# Patient Record
Sex: Female | Born: 2000 | Race: Black or African American | Hispanic: No | Marital: Single | Smoking: Never smoker
Health system: Southern US, Community
[De-identification: ages and names within clinical notes are randomized; demographics above are authoritative.]

---
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
## 2020-04-30 ENCOUNTER — Ambulatory Visit (INDEPENDENT_AMBULATORY_CARE_PROVIDER_SITE_OTHER): Payer: Medicaid Other

## 2020-04-30 ENCOUNTER — Encounter (HOSPITAL_COMMUNITY): Payer: Self-pay

## 2020-04-30 ENCOUNTER — Other Ambulatory Visit: Payer: Self-pay

## 2020-04-30 ENCOUNTER — Ambulatory Visit (HOSPITAL_COMMUNITY)
Admission: EM | Admit: 2020-04-30 | Discharge: 2020-04-30 | Disposition: A | Discharge status: Home / Self Care | Payer: Medicaid Other | Attending: Family Medicine | Admitting: Family Medicine

## 2020-04-30 DIAGNOSIS — M25571 Pain in right ankle and joints of right foot: Secondary | ICD-10-CM | POA: Diagnosis not present

## 2020-04-30 DIAGNOSIS — S93491A Sprain of other ligament of right ankle, initial encounter: Secondary | ICD-10-CM

## 2020-04-30 DIAGNOSIS — S99911A Unspecified injury of right ankle, initial encounter: Secondary | ICD-10-CM

## 2020-04-30 NOTE — ED Triage Notes (Signed)
Pt presents with pain and swelling in the right ankle x 1 day. States she twisted right ankle when walking with platform shoes.

## 2020-04-30 NOTE — Discharge Instructions (Signed)
If not allergic, you may use over the counter ibuprofen or acetaminophen as needed. ° °

## 2020-05-03 NOTE — ED Provider Notes (Signed)
  [facility]   [id number] 04/30/20 Arrival Time: 1209  ASSESSMENT & PLAN:  1. Acute right ankle pain   2. Sprain of anterior talofibular ligament of right ankle, initial encounter     I have personally viewed the imaging studies ordered this visit. No fx appreciated.  Prefers OTC ibuprofen. See AVS for instructions.  Orders Placed This Encounter  Procedures  . DG Ankle Complete Right  . Apply CAM boot   WBAT  Recommend:  Follow-up Information    [facility].   Why: If worsening or failing to improve as anticipated. Contact information: [address] [place] [address] [phone]               Reviewed expectations re: course of current medical issues. Questions answered. Outlined signs and symptoms indicating need for more acute intervention. Patient verbalized understanding. After Visit Summary given.  SUBJECTIVE: History from: patient. Phyllis Baldwin is a 19 y.o. female who reports inversion injury to R ankle; yest; walking platform shoes; twisted; gradual onset of discomfort. Able to bear wt but with discomfort. No extremity sensation changes or weakness. No OTC tx.    OBJECTIVE:  Vitals:   04/30/20 1256  BP: 109/74  Pulse: 97  Resp: 17  Temp: 98.5 F (36.9 C)  TempSrc: Oral  SpO2: 98%    General appearance: alert; no distress HEENT: [place]; AT Neck: supple with FROM Resp: unlabored respirations Extremities: . RLE: warm with well perfused appearance; fairly well localized moderate tenderness over right lateral ankle; without gross deformities; swelling: minimal; bruising: none; ankle ROM: normal, with discomfort CV: brisk extremity capillary refill of RLE; 2+ DP pulse of RLE. Skin: warm and dry; no visible rashes Neurologic: normal sensation and strength of RLE Psychological: alert and cooperative; normal mood and affect  Imaging: DG Ankle Complete Right  Result Date:  04/30/2020 CLINICAL DATA:  Right ankle injury. EXAM: RIGHT ANKLE - COMPLETE 3+ VIEW COMPARISON:  None. FINDINGS: There is no evidence of fracture, dislocation, or joint effusion. There is no evidence of arthropathy or other focal bone abnormality. Soft tissues are unremarkable. IMPRESSION: Negative. Electronically Signed   By: Lupita Raider M.D.   On: 04/30/2020 13:27      No Known Allergies  History reviewed. No pertinent past medical history. Social History   Socioeconomic History  . Marital status: Single    Spouse name: Not on file  . Number of children: Not on file  . Years of education: Not on file  . Highest education level: Not on file  Occupational History  . Not on file  Tobacco Use  . Smoking status: Never Smoker  . Smokeless tobacco: Never Used  Substance and Sexual Activity  . Alcohol use: Yes  . Drug use: Never  . Sexual activity: Never  Other Topics Concern  . Not on file  Social History Narrative  . Not on file   Social Determinants of Health   Financial Resource Strain: Not on file  Food Insecurity: Not on file  Transportation Needs: Not on file  Physical Activity: Not on file  Stress: Not on file  Social Connections: Not on file   History reviewed. No pertinent family history. History reviewed. No pertinent surgical history.    Mardella Layman, MD 05/03/20 [phone]

## 2020-11-26 ENCOUNTER — Ambulatory Visit (HOSPITAL_COMMUNITY)
Admission: EM | Admit: 2020-11-26 | Discharge: 2020-11-26 | Disposition: A | Discharge status: Home / Self Care | Payer: Medicaid Other | Attending: Urgent Care | Admitting: Urgent Care

## 2020-11-26 ENCOUNTER — Other Ambulatory Visit: Payer: Self-pay

## 2020-11-26 ENCOUNTER — Encounter (HOSPITAL_COMMUNITY): Payer: Self-pay

## 2020-11-26 DIAGNOSIS — A084 Viral intestinal infection, unspecified: Secondary | ICD-10-CM

## 2020-11-26 DIAGNOSIS — R109 Unspecified abdominal pain: Secondary | ICD-10-CM

## 2020-11-26 DIAGNOSIS — R112 Nausea with vomiting, unspecified: Secondary | ICD-10-CM

## 2020-11-26 MED ORDER — ONDANSETRON 8 MG PO TBDP: 8.0000 mg | ORAL_TABLET | Freq: Three times a day (TID) | ORAL | 0 refills | Status: DC | PRN

## 2020-11-26 MED ORDER — LOPERAMIDE HCL 2 MG PO CAPS: 2.0000 mg | ORAL_CAPSULE | Freq: Two times a day (BID) | ORAL | 0 refills | Status: DC | PRN

## 2020-11-26 NOTE — ED Triage Notes (Signed)
Pt presents with headache, nausea, vomiting, and generalized abdominal cramping X 3 days.

## 2020-11-26 NOTE — ED Provider Notes (Signed)
[facility]   MRN: [id number] DOB: 09/09/2000  Subjective:   Phyllis Baldwin is a 20 y.o. female presenting for 3 day history of nausea, vomiting, diarrhea. Started to have abdominal cramping.  Denies fever, sore throat, cough, chest pain, shortness of breath.  Distance travel outside the country, no recent antibiotic use, bloody stools, hospitalizations.  No history of GI issues.  Patient is not sexually active, has not been for 2 years.  Does not want a pregnancy test or STI testing.  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  Does not want this testing.  She is not currently taking any medications and has no known food or drug allergies.  Denies past medical and surgical history.   History reviewed. No pertinent family history.  Social History   Tobacco Use   Smoking status: Never   Smokeless tobacco: Never  Substance Use Topics   Alcohol use: Yes   Drug use: Never    ROS   Objective:   Vitals: BP 127/85 (BP Location: Right Arm)   Pulse (!) 101   Temp 98.7 F (37.1 C) (Oral)   Resp 18   LMP  (LMP Unknown)   SpO2 95%   Physical Exam Constitutional:      General: She is not in acute distress.    Appearance: Normal appearance. She is well-developed and normal weight. She is not ill-appearing, toxic-appearing or diaphoretic.  HENT:     Head: Normocephalic and atraumatic.     Right Ear: External ear normal.     Left Ear: External ear normal.     Nose: Nose normal.     Mouth/Throat:     Mouth: Mucous membranes are moist.     Pharynx: Oropharynx is clear.  Eyes:     General: No scleral icterus.    Extraocular Movements: Extraocular movements intact.     Pupils: Pupils are equal, round, and reactive to light.  Cardiovascular:     Rate and Rhythm: Normal rate and regular rhythm.     Pulses: Normal pulses.     Heart sounds: Normal heart sounds. No murmur heard.   No friction rub. No gallop.   Pulmonary:     Effort: Pulmonary effort is normal. No respiratory distress.     Breath sounds: Normal breath sounds. No stridor. No wheezing, rhonchi or rales.  Abdominal:     General: Bowel sounds are increased. There is no distension.     Palpations: Abdomen is soft. There is no mass.     Tenderness: There is generalized abdominal tenderness and tenderness in the right lower quadrant, periumbilical area, suprapubic area and left lower quadrant. There is no right CVA tenderness, left CVA tenderness, guarding or rebound.  Skin:    General: Skin is warm and dry.     Coloration: Skin is not pale.     Findings: No rash.  Neurological:     General: No focal deficit present.     Mental Status: She is alert and oriented to person, place, and time.  Psychiatric:        Mood and Affect: Mood normal.        Behavior: Behavior normal.        Thought Content: Thought content normal.        Judgment: Judgment normal.     Assessment and Plan :   PDMP not reviewed this encounter.  1. Viral gastroenteritis   2. Nausea vomiting  and diarrhea   3. Abdominal pain, unspecified abdominal location     Will manage for suspected viral gastroenteritis with supportive care.  Recommended patient hydrate well, eat light meals and maintain electrolytes.  Will use Zofran and Imodium for nausea, vomiting and diarrhea. Counseled patient on potential for adverse effects with medications prescribed/recommended today, ER and return-to-clinic precautions discussed, patient verbalized understanding.    Wallis Bamberg, [place] 11/26/20 9357

## 2021-03-29 IMAGING — DX DG ANKLE COMPLETE 3+V*R*
3 series · 3 of 3 positions shown · non-contrast
Comparison: None.

CLINICAL DATA: Right ankle injury.

EXAM:
RIGHT ANKLE - COMPLETE 3+ VIEW

[ankle ap]
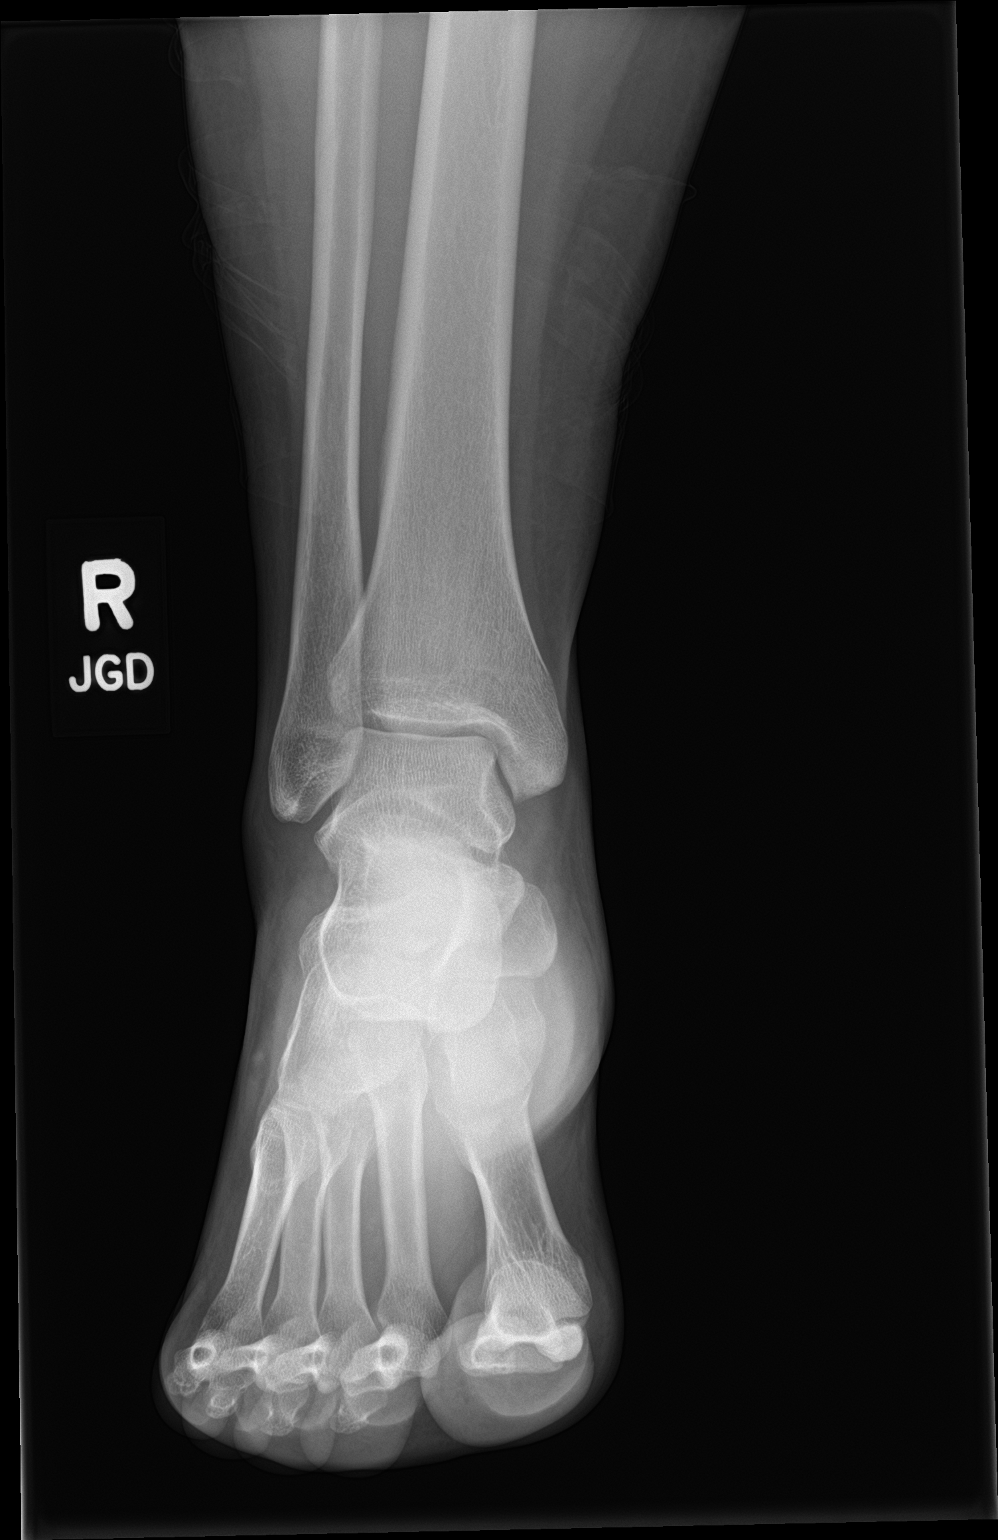

[ankle obl]
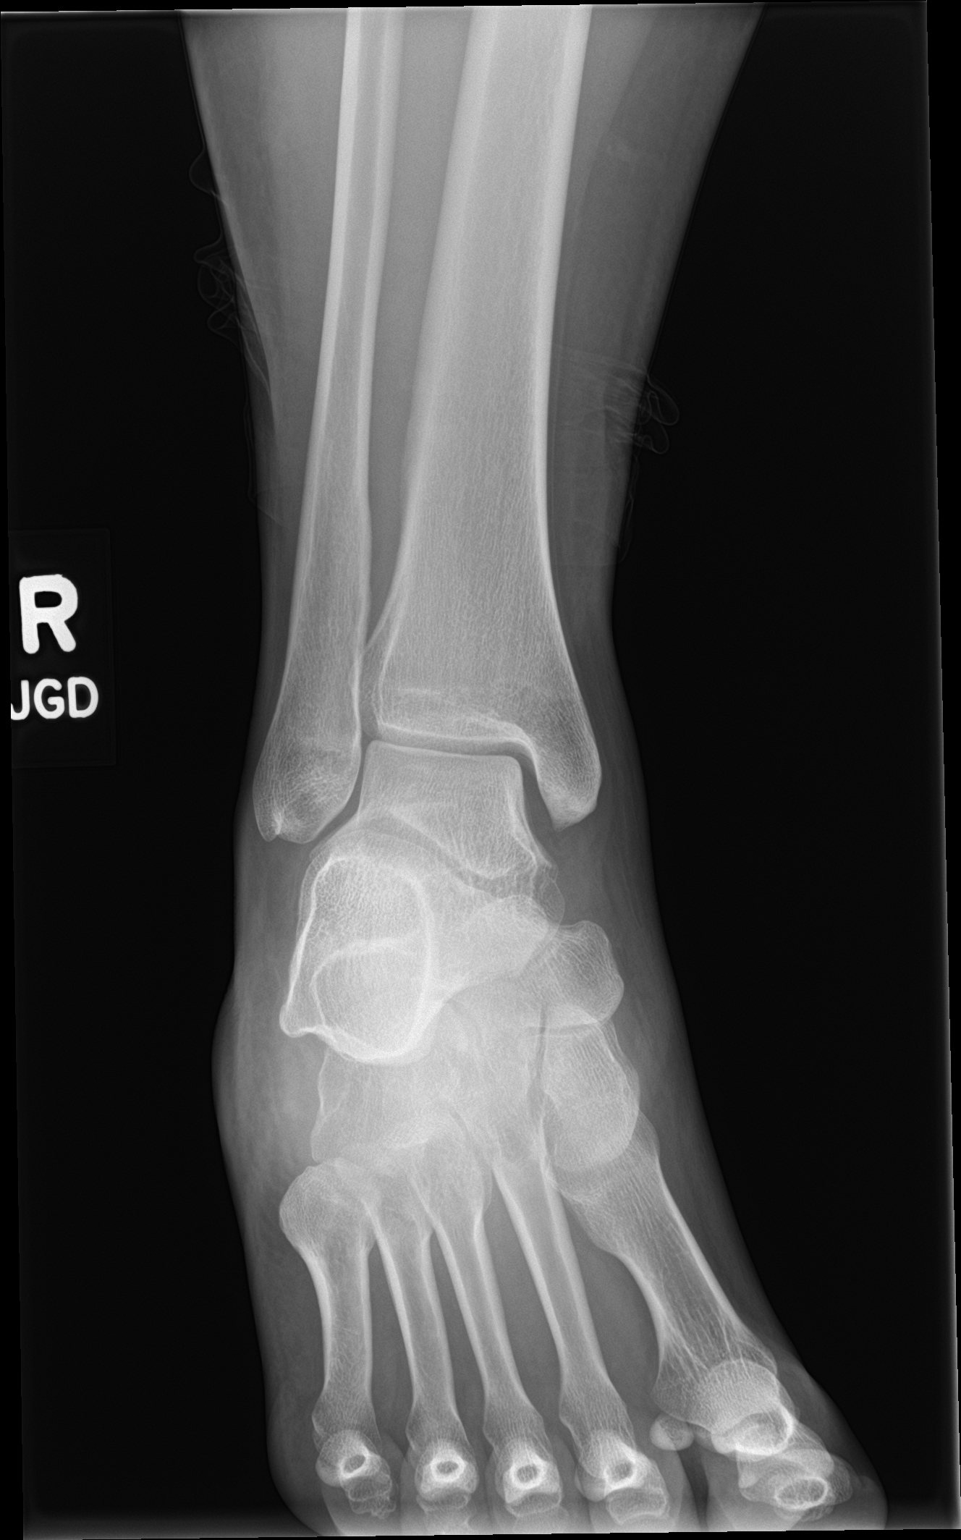

[ankle lat]
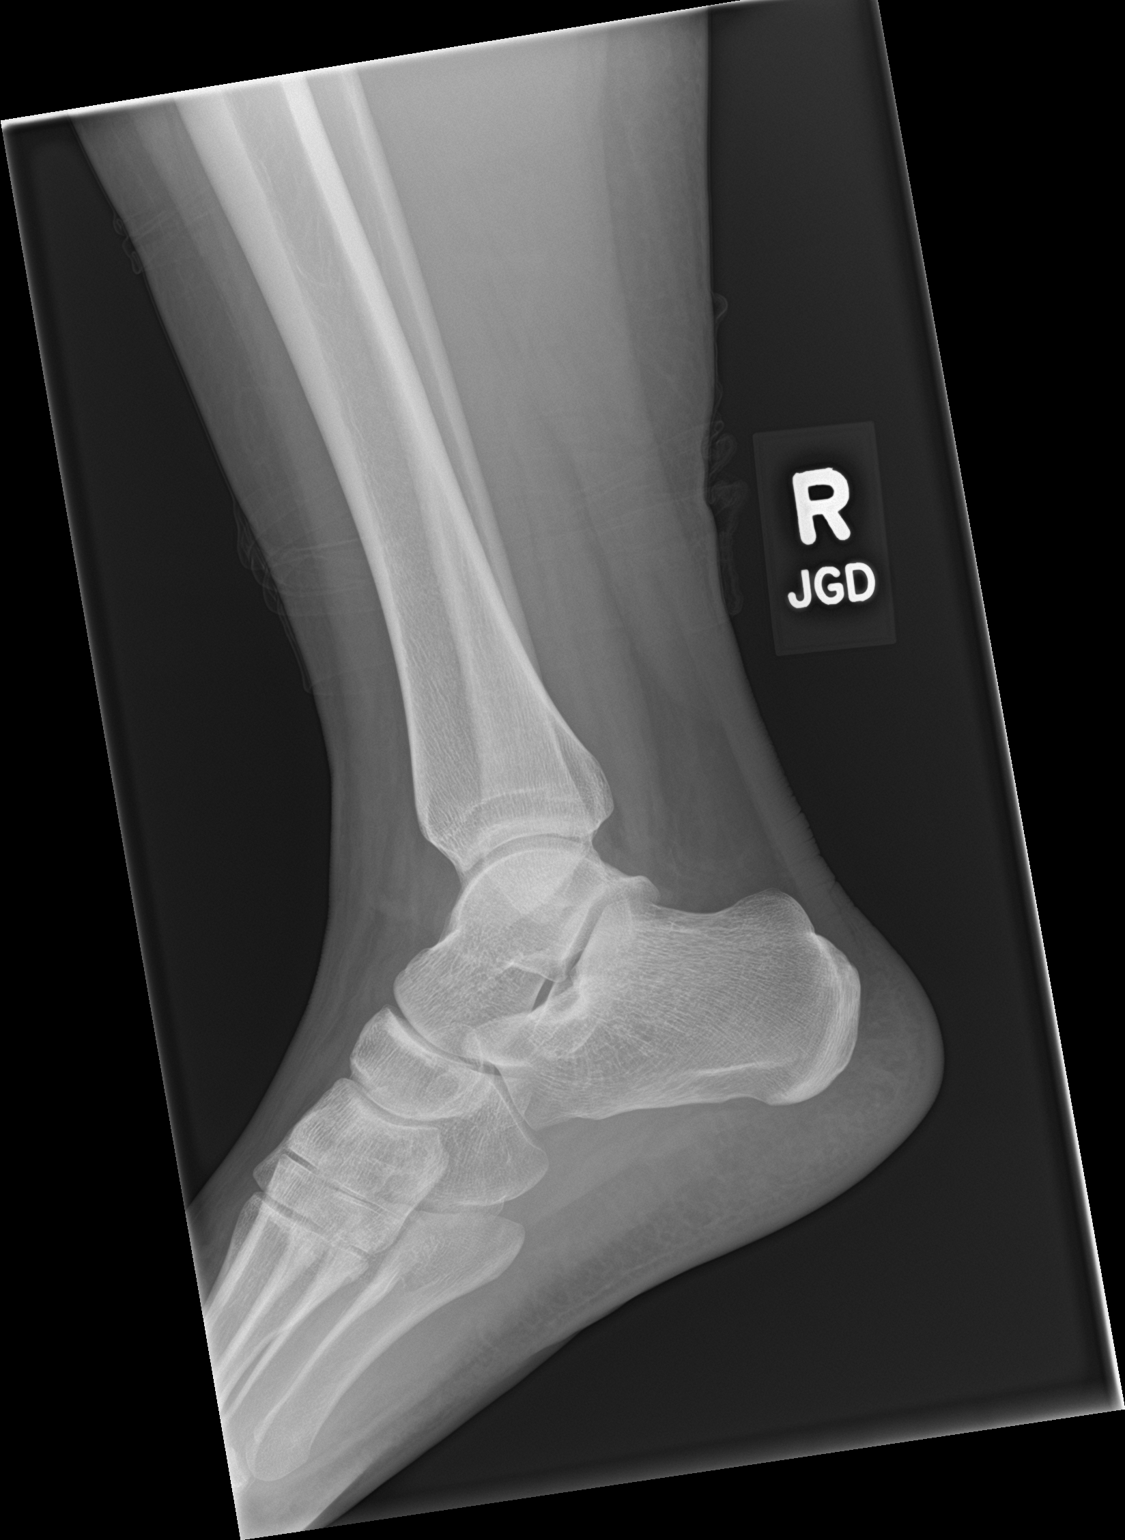

[3 of 3 positions shown; findings below may reference images not displayed]

FINDINGS: There is no evidence of fracture, dislocation, or joint effusion.
There is no evidence of arthropathy or other focal bone abnormality.
Soft tissues are unremarkable.
IMPRESSION: Negative.

## 2022-12-19 ENCOUNTER — Encounter (HOSPITAL_COMMUNITY): Payer: Self-pay

## 2022-12-19 ENCOUNTER — Ambulatory Visit (HOSPITAL_COMMUNITY)
Admission: EM | Admit: 2022-12-19 | Discharge: 2022-12-19 | Disposition: A | Discharge status: Home / Self Care | Payer: Medicaid Other | Source: Home / Self Care

## 2022-12-19 DIAGNOSIS — J029 Acute pharyngitis, unspecified: Secondary | ICD-10-CM | POA: Insufficient documentation

## 2022-12-19 DIAGNOSIS — S39012A Strain of muscle, fascia and tendon of lower back, initial encounter: Secondary | ICD-10-CM | POA: Diagnosis not present

## 2022-12-19 DIAGNOSIS — B349 Viral infection, unspecified: Secondary | ICD-10-CM | POA: Insufficient documentation

## 2022-12-19 DIAGNOSIS — Z1152 Encounter for screening for COVID-19: Secondary | ICD-10-CM | POA: Diagnosis not present

## 2022-12-19 DIAGNOSIS — X58XXXA Exposure to other specified factors, initial encounter: Secondary | ICD-10-CM | POA: Insufficient documentation

## 2022-12-19 MED ORDER — METHOCARBAMOL 500 MG PO TABS: 500.0000 mg | ORAL_TABLET | Freq: Two times a day (BID) | ORAL | 0 refills | Status: DC

## 2022-12-19 NOTE — ED Provider Notes (Signed)
[facility]    CSN: [id number] Arrival date & time: 12/19/22  1434      History   Chief Complaint Chief Complaint  Patient presents with   Back Pain   Covid Exposure    HPI Phyllis Baldwin is a 22 y.o. female.   Patient presents to clinic for recent COVID-19 exposure.  She has been feeling hot at night and uncomfortable, no documented fevers, has not checked temperature.  She has also had sore throat and mucus with nasal congestion.  Reports her chest feels congested.  She denies any wheezing or chest pain.  Does not smoke, no history of asthma.  She also complains of lower midline and left-sided back pain that has been ongoing for the past few weeks.  Denies any traumas or falls.  Denies any numbness or tingling.  Reports pain with bending and twisting.  Denies dysuria. Denies N/V/D.      The history is provided by the patient and medical records.  Back Pain Associated symptoms: no abdominal pain, no chest pain, no dysuria and no fever     History reviewed. No pertinent past medical history.  There are no problems to display for this patient.   History reviewed. No pertinent surgical history.  OB History   No obstetric history on file.      Home Medications    Prior to Admission medications   Medication Sig Start Date End Date Taking? Authorizing Provider  methocarbamol (ROBAXIN) 500 MG tablet Take 1 tablet (500 mg total) by mouth 2 (two) times daily. 12/19/22  Yes Treniya Lobb, [place], FNP    Family History History reviewed. No pertinent family history.  Social History Social History   Tobacco Use   Smoking status: Never    Passive exposure: Never   Smokeless tobacco: Never  Vaping Use   Vaping status: Every Day   Substances: Nicotine, Flavoring  Substance Use Topics   Alcohol use: Not Currently   Drug use: Never     Allergies   Patient has no known allergies.   Review of Systems Review of Systems  Constitutional:  Negative for  fever.  HENT:  Positive for congestion, postnasal drip, rhinorrhea and sore throat.   Respiratory:  Negative for cough, shortness of breath and wheezing.   Cardiovascular:  Negative for chest pain.  Gastrointestinal:  Negative for abdominal pain, nausea and vomiting.  Genitourinary:  Negative for dysuria.  Musculoskeletal:  Positive for back pain.     Physical Exam Triage Vital Signs ED Triage Vitals  Encounter Vitals Group     BP 12/19/22 1650 106/76     Systolic BP Percentile --      Diastolic BP Percentile --      Pulse Rate 12/19/22 1650 81     Resp 12/19/22 1650 18     Temp 12/19/22 1650 98.4 F (36.9 C)     Temp Source 12/19/22 1650 Oral     SpO2 12/19/22 1650 96 %     Weight --      Height 12/19/22 1649 5\' 4"  (1.626 m)     Head Circumference --      Peak Flow --      Pain Score 12/19/22 1647 6     Pain Loc --      Pain Education --      Exclude from Growth Chart --    No data found.  Updated Vital Signs BP 106/76 (BP Location: Left Arm)   Pulse 81  Temp 98.4 F (36.9 C) (Oral)   Resp 18   Ht 5\' 4"  (1.626 m)   LMP 11/29/2022 (Exact Date)   SpO2 96%   Visual Acuity Right Eye Distance:   Left Eye Distance:   Bilateral Distance:    Right Eye Near:   Left Eye Near:    Bilateral Near:     Physical Exam Vitals and nursing note reviewed.  Constitutional:      Appearance: Normal appearance.  HENT:     Head: Normocephalic and atraumatic.     Right Ear: External ear normal.     Left Ear: External ear normal.     Nose: Nose normal.     Mouth/Throat:     Mouth: Mucous membranes are moist.     Pharynx: Posterior oropharyngeal erythema present.  Eyes:     Conjunctiva/sclera: Conjunctivae normal.  Cardiovascular:     Rate and Rhythm: Normal rate and regular rhythm.     Heart sounds: Normal heart sounds. No murmur heard. Pulmonary:     Effort: Pulmonary effort is normal. No respiratory distress.     Breath sounds: Normal breath sounds.  Musculoskeletal:         General: Tenderness present. No swelling, deformity or signs of injury.     Cervical back: Normal and normal range of motion.     Thoracic back: Normal.     Lumbar back: Tenderness present. Decreased range of motion.       Back:     Comments: MS TTP to left lower lumbar area  Lymphadenopathy:     Cervical: Cervical adenopathy present.  Skin:    General: Skin is warm and dry.  Neurological:     General: No focal deficit present.     Mental Status: She is alert and oriented to person, place, and time.  Psychiatric:        Mood and Affect: Mood normal.        Behavior: Behavior normal. Behavior is cooperative.      UC Treatments / Results  Labs (all labs ordered are listed, but only abnormal results are displayed) Labs Reviewed  SARS CORONAVIRUS 2 (TAT 6-24 HRS)    EKG   Radiology No results found.  Procedures Procedures (including critical care time)  Medications Ordered in UC Medications - No data to display  Initial Impression / Assessment and Plan / UC Course  I have reviewed the triage vital signs and the nursing notes.  Pertinent labs & imaging results that were available during my care of the patient were reviewed by me and considered in my medical decision making (see chart for details).  Vitals in triage reviewed, patient is hemodynamically stable.  Lungs are vesicular posteriorly.  Recent COVID-19 exposure at work, COVID-19 testing obtained today.  Symptomatic management discussed.  Lumbar back pain appears musculoskeletal, pain elicited with range of motion.  Spine without step-off or deformity, atraumatic back pain.  Without red flag symptoms of cauda equina, numbness or tingling.  Patient declined Toradol injection in clinic.  Discussed rest, anti-inflammatories and muscle relaxers.   Plan of care, follow-up care and return precautions given, no questions at this time.  Work note provided.    Final Clinical Impressions(s) / UC Diagnoses   Final  diagnoses:  Strain of lumbar region, initial encounter  Viral illness  Acute pharyngitis, unspecified etiology     Discharge Instructions      Your back pain appears to be muscular, please rest, use warm compresses and gentle stretching.  You can also use the muscle relaxers.  Do not drink or drive on these medications as they may make you drowsy.  Your other symptoms appear consistent with a viral illness, we have swabbed you for COVID-19 and your results will be available tomorrow.  Please rest, ensure you are drinking at least 64 ounces of water daily, and alternate between 500 mg of Tylenol and 800 mg of ibuprofen every 4-6 hours as needed for body aches, fever, chills and other aches and pains.  Please return to clinic for any new or urgent symptoms.      ED Prescriptions     Medication Sig Dispense Auth. Provider   methocarbamol (ROBAXIN) 500 MG tablet Take 1 tablet (500 mg total) by mouth 2 (two) times daily. 20 tablet Herchel Hopkin, [place], [place]      PDMP not reviewed this encounter.   Elon Eoff, [place] N, [place] 12/19/22 1728

## 2022-12-19 NOTE — Discharge Instructions (Addendum)
Your back pain appears to be muscular, please rest, use warm compresses and gentle stretching.  You can also use the muscle relaxers.  Do not drink or drive on these medications as they may make you drowsy.  Your other symptoms appear consistent with a viral illness, we have swabbed you for COVID-19 and your results will be available tomorrow.  Please rest, ensure you are drinking at least 64 ounces of water daily, and alternate between 500 mg of Tylenol and 800 mg of ibuprofen every 4-6 hours as needed for body aches, fever, chills and other aches and pains.  Please return to clinic for any new or urgent symptoms.

## 2022-12-19 NOTE — ED Triage Notes (Signed)
back pain x 2 weeks and sore throat x 1 day and dizzy. Was exposed to COVID 2 days ago. Patient tested negative with an expired home test.   No known falls or injuries. No urinary symptoms. Sharp pain in the mid to low back with movement.

## 2023-08-02 ENCOUNTER — Emergency Department (HOSPITAL_COMMUNITY)

## 2023-08-02 ENCOUNTER — Other Ambulatory Visit: Payer: Self-pay

## 2023-08-02 ENCOUNTER — Encounter (HOSPITAL_COMMUNITY): Payer: Self-pay | Admitting: Emergency Medicine

## 2023-08-02 ENCOUNTER — Emergency Department (HOSPITAL_COMMUNITY)
Admission: EM | Admit: 2023-08-02 | Discharge: 2023-08-03 | Discharge status: Against Medical Advice | Attending: Emergency Medicine | Admitting: Emergency Medicine

## 2023-08-02 DIAGNOSIS — R309 Painful micturition, unspecified: Secondary | ICD-10-CM | POA: Insufficient documentation

## 2023-08-02 DIAGNOSIS — R109 Unspecified abdominal pain: Secondary | ICD-10-CM | POA: Diagnosis present

## 2023-08-02 DIAGNOSIS — Z5321 Procedure and treatment not carried out due to patient leaving prior to being seen by health care provider: Secondary | ICD-10-CM | POA: Insufficient documentation

## 2023-08-02 DIAGNOSIS — R112 Nausea with vomiting, unspecified: Secondary | ICD-10-CM | POA: Diagnosis not present

## 2023-08-02 LAB — URINALYSIS, ROUTINE W REFLEX MICROSCOPIC
Bilirubin Urine: NEGATIVE
Glucose, UA: NEGATIVE mg/dL
Hgb urine dipstick: NEGATIVE
Ketones, ur: NEGATIVE mg/dL
Nitrite: NEGATIVE
Protein, ur: NEGATIVE mg/dL
Specific Gravity, Urine: 1.025 (ref 1.005–1.030)
pH: 5 (ref 5.0–8.0)

## 2023-08-02 LAB — PREGNANCY, URINE: Preg Test, Ur: NEGATIVE

## 2023-08-02 LAB — COMPREHENSIVE METABOLIC PANEL
ALT: 13 U/L (ref 0–44)
AST: 17 U/L (ref 15–41)
Albumin: 3.3 g/dL — ABNORMAL LOW (ref 3.5–5.0)
Alkaline Phosphatase: 49 U/L (ref 38–126)
Anion gap: 11 (ref 5–15)
BUN: 8 mg/dL (ref 6–20)
CO2: 23 mmol/L (ref 22–32)
Calcium: 9 mg/dL (ref 8.9–10.3)
Chloride: 101 mmol/L (ref 98–111)
Creatinine, Ser: 0.77 mg/dL (ref 0.44–1.00)
GFR, Estimated: >60 mL/min (ref >60)
Glucose, Bld: 81 mg/dL (ref 70–99)
Potassium: 3.7 mmol/L (ref 3.5–5.1)
Sodium: 135 mmol/L (ref 135–145)
Total Bilirubin: 0.5 mg/dL (ref 0.0–1.2)
Total Protein: 6.7 g/dL (ref 6.5–8.1)

## 2023-08-02 LAB — CBC WITH DIFFERENTIAL/PLATELET
Abs Immature Granulocytes: 0.03 10*3/uL (ref 0.00–0.07)
Basophils Absolute: 0.1 10*3/uL (ref 0.0–0.1)
Basophils Relative: 1 %
Eosinophils Absolute: 0.1 10*3/uL (ref 0.0–0.5)
Eosinophils Relative: 1 %
HCT: 40.6 % (ref 36.0–46.0)
Hemoglobin: 13.2 g/dL (ref 12.0–15.0)
Immature Granulocytes: 0 %
Lymphocytes Relative: 27 %
Lymphs Abs: 2 10*3/uL (ref 0.7–4.0)
MCH: 30.6 pg (ref 26.0–34.0)
MCHC: 32.5 g/dL (ref 30.0–36.0)
MCV: 94.2 fL (ref 80.0–100.0)
Monocytes Absolute: 1 10*3/uL (ref 0.1–1.0)
Monocytes Relative: 13 %
Neutro Abs: 4.3 10*3/uL (ref 1.7–7.7)
Neutrophils Relative %: 58 %
Platelets: 296 10*3/uL (ref 150–400)
RBC: 4.31 MIL/uL (ref 3.87–5.11)
RDW: 12.2 % (ref 11.5–15.5)
WBC: 7.4 10*3/uL (ref 4.0–10.5)
nRBC: 0 % (ref 0.0–0.2)

## 2023-08-02 NOTE — ED Notes (Signed)
 Pt called twice for ct with no answer

## 2023-08-02 NOTE — ED Provider Triage Note (Signed)
 Emergency Medicine Provider Triage Evaluation Note  Keyon Winnick , a 23 y.o. female  was evaluated in triage.  Pt complains of left flank and abdomen pain.  Patient reports intermittent left-sided abdominal pain for the past several days.  Today she woke up with persistent pain that radiates to the left flank.  States that she had some burning with urination earlier today as well as nausea and vomiting.  Denies any fevers or changes to bowel habits.  Review of Systems  Positive: Left-sided abdominal and flank pain, nausea, vomiting Negative: Fevers, anuria  Physical Exam  There were no vitals taken for this visit. Gen:   Awake, no distress   Resp:  Normal effort  MSK:   Moves extremities without difficulty  Other:  TTP of left lower quadrant and left flank  Medical Decision Making  Medically screening exam initiated at 7:47 PM.  Appropriate orders placed.  Nikoletta Varma was informed that the remainder of the evaluation will be completed by another provider, this initial triage assessment does not replace that evaluation, and the importance of remaining in the ED until their evaluation is complete.   Maxwell Marion, PA-C 08/02/23 1949

## 2023-08-02 NOTE — ED Triage Notes (Signed)
 Pt in with L upper abdomen cramping pain, ongoing x 3 mos. When pain severe, it radiates to L flank area. Pt states she notices when she drinks [place], she has trouble urinating. Pt reoprts dark and malodorous urine

## 2023-08-03 ENCOUNTER — Encounter (HOSPITAL_COMMUNITY): Payer: Self-pay

## 2023-08-03 ENCOUNTER — Ambulatory Visit (HOSPITAL_COMMUNITY)
Admission: EM | Admit: 2023-08-03 | Discharge: 2023-08-03 | Disposition: A | Discharge status: Home / Self Care | Attending: Internal Medicine | Admitting: Internal Medicine

## 2023-08-03 DIAGNOSIS — R110 Nausea: Secondary | ICD-10-CM | POA: Diagnosis not present

## 2023-08-03 DIAGNOSIS — N10 Acute tubulo-interstitial nephritis: Secondary | ICD-10-CM | POA: Insufficient documentation

## 2023-08-03 LAB — POCT URINALYSIS DIP (MANUAL ENTRY)
Blood, UA: NEGATIVE
Glucose, UA: NEGATIVE mg/dL
Ketones, POC UA: NEGATIVE mg/dL
Leukocytes, UA: NEGATIVE
Nitrite, UA: NEGATIVE
Spec Grav, UA: >=1.03 — AB
Urobilinogen, UA: 2 U/dL — AB
pH, UA: 6

## 2023-08-03 MED ORDER — ONDANSETRON 4 MG PO TBDP: 4.0000 mg | ORAL_TABLET | Freq: Three times a day (TID) | ORAL | 0 refills | Status: DC | PRN

## 2023-08-03 MED ORDER — SULFAMETHOXAZOLE-TRIMETHOPRIM 800-160 MG PO TABS: 1.0000 | ORAL_TABLET | Freq: Two times a day (BID) | ORAL | 0 refills | Status: AC

## 2023-08-03 NOTE — ED Provider Notes (Addendum)
 [facility]    CSN: [id number] Arrival date & time: 08/03/23  1313      History   Chief Complaint Chief Complaint  Patient presents with   Abdominal Pain    HPI Phyllis Baldwin is a 23 y.o. female.   Patient presents to urgent care for evaluation of generalized abdominal pain, left flank pain, urinary frequency, urinary urgency that started approximately 1 week ago and worsened last night.  She started having chills without documented fever at home and nausea without vomiting last night prompting emergency department visit.  She left the ER without being seen by provider last night. She states she is binge drinking 4-5 red bull energy drinks per week for the last 2 months and this increased to 6 or 7 red bull energy drinks last week.  She states abdominal discomfort improved slightly after drinking a few glasses of water and is worsened by movement/frequent urination.  No recent trauma/injuries to the flanks, abdomen.  Denies recent vomiting, gross hematuria, dysuria, dizziness, headache, vaginal symptoms.  No personal/family history of renal stones.  She does not take an SGLT2 inhibitor and denies history of immunosuppression/recent antibiotic use.Pregnancy test in the emergency room yesterday was negative.  Urinalysis yesterday in the ER shows small leukocytes and trace bacteria.  She has not attempted treatment of symptoms PTA.   Abdominal Pain   History reviewed. No pertinent past medical history.  There are no active problems to display for this patient.   History reviewed. No pertinent surgical history.  OB History   No obstetric history on file.      Home Medications    Prior to Admission medications   Medication Sig Start Date End Date Taking? Authorizing Provider  ondansetron (ZOFRAN-ODT) 4 MG disintegrating tablet Take 1 tablet (4 mg total) by mouth every 8 (eight) hours as needed for nausea or vomiting. 08/03/23  Yes Carlisle Beers, FNP   sulfamethoxazole-trimethoprim (BACTRIM DS) 800-160 MG tablet Take 1 tablet by mouth 2 (two) times daily for 7 days. 08/03/23 08/10/23 Yes Konrad Hoak, Donavan Burnet, FNP    Family History History reviewed. No pertinent family history.  Social History Social History   Tobacco Use   Smoking status: Never    Passive exposure: Never   Smokeless tobacco: Never  Vaping Use   Vaping status: Every Day   Substances: Nicotine, Flavoring  Substance Use Topics   Alcohol use: Not Currently   Drug use: Never     Allergies   Patient has no known allergies.   Review of Systems Review of Systems  Gastrointestinal:  Positive for abdominal pain.  Per HPI   Physical Exam Triage Vital Signs ED Triage Vitals [08/03/23 1520]  Encounter Vitals Group     BP 104/64     Systolic BP Percentile      Diastolic BP Percentile      Pulse Rate 98     Resp 18     Temp 98.1 F (36.7 C)     Temp Source Oral     SpO2 96 %     Weight      Height      Head Circumference      Peak Flow      Pain Score 7     Pain Loc      Pain Education      Exclude from Growth Chart    No data found.  Updated Vital Signs BP 104/64 (BP Location: Right Arm)   Pulse 98  Temp 98.1 F (36.7 C) (Oral)   Resp 18   LMP 07/20/2023 (Exact Date)   SpO2 96%   Visual Acuity Right Eye Distance:   Left Eye Distance:   Bilateral Distance:    Right Eye Near:   Left Eye Near:    Bilateral Near:     Physical Exam Vitals and nursing note reviewed.  Constitutional:      Appearance: She is not ill-appearing or toxic-appearing.  HENT:     Head: Normocephalic and atraumatic.     Right Ear: Hearing and external ear normal.     Left Ear: Hearing and external ear normal.     Nose: Nose normal.     Mouth/Throat:     Lips: Pink.  Eyes:     General: Lids are normal. Vision grossly intact. Gaze aligned appropriately.     Extraocular Movements: Extraocular movements intact.     Conjunctiva/sclera: Conjunctivae normal.   Cardiovascular:     Rate and Rhythm: Normal rate and regular rhythm.     Heart sounds: Normal heart sounds, S1 normal and S2 normal.  Pulmonary:     Effort: Pulmonary effort is normal. No respiratory distress.     Breath sounds: Normal breath sounds and air entry.  Abdominal:     General: Abdomen is flat. Bowel sounds are normal.     Palpations: Abdomen is soft.     Tenderness: There is abdominal tenderness in the right lower quadrant, suprapubic area, left upper quadrant and left lower quadrant. There is left CVA tenderness. There is no right CVA tenderness, guarding or rebound. Negative signs include Murphy's sign and McBurney's sign.  Musculoskeletal:     Cervical back: Neck supple.  Skin:    General: Skin is warm and dry.     Capillary Refill: Capillary refill takes less than 2 seconds.     Findings: No rash.  Neurological:     General: No focal deficit present.     Mental Status: She is alert and oriented to person, place, and time. Mental status is at baseline.     Cranial Nerves: No dysarthria or facial asymmetry.  Psychiatric:        Mood and Affect: Mood normal.        Speech: Speech normal.        Behavior: Behavior normal.        Thought Content: Thought content normal.        Judgment: Judgment normal.      UC Treatments / Results  Labs (all labs ordered are listed, but only abnormal results are displayed) Labs Reviewed  POCT URINALYSIS DIP (MANUAL ENTRY) - Abnormal; Notable for the following components:      Result Value   Color, UA straw (*)    Clarity, UA cloudy (*)    Bilirubin, UA small (*)    Spec Grav, UA >=1.030 (*)    Protein Ur, POC trace (*)    Urobilinogen, UA 2.0 (*)    All other components within normal limits  URINE CULTURE    EKG   Radiology No results found.  Procedures Procedures (including critical care time)  Medications Ordered in UC Medications - No data to display  Initial Impression / Assessment and Plan / UC Course  I  have reviewed the triage vital signs and the nursing notes.  Pertinent labs & imaging results that were available during my care of the patient were reviewed by me and considered in my medical decision making (see chart for  details).   1.  Acute pyelonephritis Reviewed lab findings and workup from emergency department last night showing leukocytes and small bacteria to patient's urinalysis.  Urinalysis today shows cloudy urine, protein, urobilinogen, elevated specific gravity, and bilirubin without any red blood cells or leukocytes. Abdominal exam is without peritoneal signs. Urine pregnancy test yesterday in the emergency room was negative, labs unremarkable, no signs of acute kidney injury. Patient's presentation is highly suspicious for pyelonephritis, low suspicion for renal stone, constipation, norovirus, etc. I had like to go ahead and treat with Bactrim twice daily for 7 days.  Urine culture is pending. Zofran as needed for nausea and vomiting.  Vitals normal, she is overall well-appearing. Symptoms further exacerbated by frequent intake of urinary irritants (red bull).  Discussed limiting intake of urinary irritants and increasing water consumption to prevent further UTIs.  Counseled patient on potential for adverse effects with medications prescribed/recommended today, strict ER and return-to-clinic precautions discussed, patient verbalized understanding.    Final Clinical Impressions(s) / UC Diagnoses   Final diagnoses:  Acute pyelonephritis  Nausea without vomiting     Discharge Instructions      Take Bactrim antibiotic every 12 hours for the next 7 days. Limit the amount of caffeine and soda you are drinking. Increase your water intake to at least 64 ounces of water per day.  I have sent your urine for a culture to determine the type of bacteria that is growing in your urine. Staff will call you if we need to change her antibiotic based on urine culture.  If you remain  with persistent abdominal pain, upper/lower back pain, fever, chills, nausea, vomiting, etc. despite Bactrim antibiotic, I would like for you to proceed to the nearest emergency room for further workup and evaluation. Otherwise, follow-up with PCP as needed.     ED Prescriptions     Medication Sig Dispense Auth. Provider   sulfamethoxazole-trimethoprim (BACTRIM DS) 800-160 MG tablet Take 1 tablet by mouth 2 (two) times daily for 7 days. 14 tablet Reita May M, FNP   ondansetron (ZOFRAN-ODT) 4 MG disintegrating tablet Take 1 tablet (4 mg total) by mouth every 8 (eight) hours as needed for nausea or vomiting. 20 tablet Carlisle Beers, FNP      PDMP not reviewed this encounter.      Carlisle Beers, [place] 08/03/23 1729

## 2023-08-03 NOTE — ED Triage Notes (Signed)
 Pt c/o intermittent lt mid abdominal pain x2 months. States worse when drinking red bull. States ran into a table yesterday at work hitting the same area and pain is tender to touch and worse. Denies taking any meds for sx's.

## 2023-08-03 NOTE — Discharge Instructions (Addendum)
 Take Bactrim antibiotic every 12 hours for the next 7 days. Limit the amount of caffeine and soda you are drinking. Increase your water intake to at least 64 ounces of water per day.  I have sent your urine for a culture to determine the type of bacteria that is growing in your urine. Staff will call you if we need to change her antibiotic based on urine culture.  If you remain with persistent abdominal pain, upper/lower back pain, fever, chills, nausea, vomiting, etc. despite Bactrim antibiotic, I would like for you to proceed to the nearest emergency room for further workup and evaluation. Otherwise, follow-up with PCP as needed.

## 2023-08-04 LAB — URINE CULTURE

## 2023-11-24 ENCOUNTER — Ambulatory Visit (HOSPITAL_COMMUNITY)
Admission: EM | Admit: 2023-11-24 | Discharge: 2023-11-24 | Disposition: A | Discharge status: Home / Self Care | Attending: Emergency Medicine | Admitting: Emergency Medicine

## 2023-11-24 ENCOUNTER — Encounter (HOSPITAL_COMMUNITY): Payer: Self-pay

## 2023-11-24 DIAGNOSIS — J069 Acute upper respiratory infection, unspecified: Secondary | ICD-10-CM

## 2023-11-24 LAB — POC SARS CORONAVIRUS 2 AG -  ED: SARS Coronavirus 2 Ag: NEGATIVE

## 2023-11-24 MED ORDER — BENZONATATE 100 MG PO CAPS: 100.0000 mg | ORAL_CAPSULE | Freq: Three times a day (TID) | ORAL | 0 refills | Status: AC

## 2023-11-24 MED ORDER — AZELASTINE HCL 0.1 % NA SOLN: 2.0000 | Freq: Two times a day (BID) | NASAL | 0 refills | Status: AC

## 2023-11-24 MED ORDER — PROMETHAZINE-DM 6.25-15 MG/5ML PO SYRP: 5.0000 mL | ORAL_SOLUTION | Freq: Every evening | ORAL | 0 refills | Status: AC | PRN

## 2023-11-24 NOTE — ED Triage Notes (Signed)
 Chief Complaint: intense productive cough, unable to taste, sore throat, side pain, back pain, nasal congestion, feeling hot/feverish, and sweats.   Sick exposure: No  Onset: This past Saturday night.   Prescriptions or OTC medications tried: No    New foods, medications, or products: No  Recent Travel: No

## 2023-11-24 NOTE — Discharge Instructions (Signed)
 Your COVID test was negative today.  As discussed I believe your symptoms are likely related to a viral respiratory illness. You can take Tessalon every 8 hours as needed for cough. Take Promethazine DM cough syrup at bedtime as needed for cough.  This can make you drowsy so do not drive, work, or drink alcohol while taking this. Use azelastine nasal spray twice daily to help with congestion. Otherwise she can take over-the-counter Mucinex to help with cough and congestion. Alternate between 650 mg of Tylenol and 400 mg of ibuprofen every 6-8 hours as needed for any pain or fever. Make sure you are staying hydrated and getting plenty of rest. Return here as needed.

## 2023-11-24 NOTE — ED Provider Notes (Signed)
 [facility]    CSN: [id number] Arrival date & time: 11/24/23  1142      History   Chief Complaint Chief Complaint  Patient presents with   Cough    HPI Phyllis Baldwin is a 23 y.o. female.   Patient presents with productive cough, sore throat, nasal congestion, chills, sweats, body aches, and loss of taste that began on 6/28.  Patient states that she has also had some intermittent shortness of breath and did have one episode of vomiting yesterday.   Denies known fever, chest pain, diarrhea, and abdominal pain.  Patient denies any known sick exposures.  Patient denies taking medications for her symptoms.  The history is provided by the patient and medical records.  Cough   History reviewed. No pertinent past medical history.  There are no active problems to display for this patient.   History reviewed. No pertinent surgical history.  OB History   No obstetric history on file.      Home Medications    Prior to Admission medications   Medication Sig Start Date End Date Taking? Authorizing Provider  azelastine (ASTELIN) 0.1 % nasal spray Place 2 sprays into both nostrils 2 (two) times daily. Use in each nostril as directed 11/24/23  Yes Johnie Flaming A, NP  benzonatate (TESSALON) 100 MG capsule Take 1 capsule (100 mg total) by mouth every 8 (eight) hours. 11/24/23  Yes Johnie Flaming A, NP  promethazine-dextromethorphan (PROMETHAZINE-DM) 6.25-15 MG/5ML syrup Take 5 mLs by mouth at bedtime as needed for cough. 11/24/23  Yes Johnie Flaming LABOR, NP    Family History History reviewed. No pertinent family history.  Social History Social History   Tobacco Use   Smoking status: Never    Passive exposure: Never   Smokeless tobacco: Never  Vaping Use   Vaping status: Every Day   Substances: Nicotine, Flavoring  Substance Use Topics   Alcohol use: Yes   Drug use: Never     Allergies   Patient has no known allergies.   Review of Systems Review of  Systems  Respiratory:  Positive for cough.    Per HPI  Physical Exam Triage Vital Signs ED Triage Vitals  Encounter Vitals Group     BP 11/24/23 1227 108/78     Girls Systolic BP Percentile --      Girls Diastolic BP Percentile --      Boys Systolic BP Percentile --      Boys Diastolic BP Percentile --      Pulse Rate 11/24/23 1227 (!) 110     Resp 11/24/23 1227 18     Temp 11/24/23 1227 99.2 F (37.3 C)     Temp Source 11/24/23 1227 Oral     SpO2 11/24/23 1227 95 %     Weight 11/24/23 1226 236 lb (107 kg)     Height 11/24/23 1226 5' 4 (1.626 m)     Head Circumference --      Peak Flow --      Pain Score 11/24/23 1225 7     Pain Loc --      Pain Education --      Exclude from Growth Chart --    No data found.  Updated Vital Signs BP 108/78 (BP Location: Right Arm)   Pulse (!) 110   Temp 99.2 F (37.3 C) (Oral)   Resp 18   Ht 5' 4 (1.626 m)   Wt 236 lb (107 kg)   LMP 11/04/2023 (Approximate)  SpO2 95%   BMI 40.51 kg/m   Visual Acuity Right Eye Distance:   Left Eye Distance:   Bilateral Distance:    Right Eye Near:   Left Eye Near:    Bilateral Near:     Physical Exam Vitals and nursing note reviewed.  Constitutional:      General: She is awake. She is not in acute distress.    Appearance: Normal appearance. She is well-developed and well-groomed. She is ill-appearing. She is not toxic-appearing or diaphoretic.  HENT:     Right Ear: Tympanic membrane, ear canal and external ear normal.     Left Ear: Tympanic membrane, ear canal and external ear normal.     Nose: Congestion and rhinorrhea present.     Mouth/Throat:     Mouth: Mucous membranes are moist.     Pharynx: Posterior oropharyngeal erythema and postnasal drip present.   Cardiovascular:     Rate and Rhythm: Normal rate and regular rhythm.  Pulmonary:     Effort: Pulmonary effort is normal.     Breath sounds: Normal breath sounds.   Skin:    General: Skin is warm and dry.    Neurological:     Mental Status: She is alert.   Psychiatric:        Behavior: Behavior is cooperative.      UC Treatments / Results  Labs (all labs ordered are listed, but only abnormal results are displayed) Labs Reviewed  POC SARS CORONAVIRUS 2 AG -  ED    EKG   Radiology No results found.  Procedures Procedures (including critical care time)  Medications Ordered in UC Medications - No data to display  Initial Impression / Assessment and Plan / UC Course  I have reviewed the triage vital signs and the nursing notes.  Pertinent labs & imaging results that were available during my care of the patient were reviewed by me and considered in my medical decision making (see chart for details).     Patient is mildly ill-appearing.  Congestion and rhinorrhea are present, mild erythema and PND noted to pharynx.  Lungs clear bilaterally to auscultation.  COVID testing negative.  Symptoms likely viral in nature.  Prescribed Tessalon Promethazine DM as needed for cough.  Prescribed azelastine nasal spray for congestion.  Discussed over-the-counter medications as needed for symptoms.  Discussed follow-up and return precautions. Final Clinical Impressions(s) / UC Diagnoses   Final diagnoses:  Viral URI with cough     Discharge Instructions      Your COVID test was negative today.  As discussed I believe your symptoms are likely related to a viral respiratory illness. You can take Tessalon every 8 hours as needed for cough. Take Promethazine DM cough syrup at bedtime as needed for cough.  This can make you drowsy so do not drive, work, or drink alcohol while taking this. Use azelastine nasal spray twice daily to help with congestion. Otherwise she can take over-the-counter Mucinex to help with cough and congestion. Alternate between 650 mg of Tylenol and 400 mg of ibuprofen every 6-8 hours as needed for any pain or fever. Make sure you are staying hydrated and getting  plenty of rest. Return here as needed.     ED Prescriptions     Medication Sig Dispense Auth. Provider   benzonatate (TESSALON) 100 MG capsule Take 1 capsule (100 mg total) by mouth every 8 (eight) hours. 21 capsule Johnie Flaming A, NP   promethazine-dextromethorphan (PROMETHAZINE-DM) 6.25-15 MG/5ML syrup Take 5  mLs by mouth at bedtime as needed for cough. 118 mL Johnie, Soleia Badolato A, NP   azelastine (ASTELIN) 0.1 % nasal spray Place 2 sprays into both nostrils 2 (two) times daily. Use in each nostril as directed 30 mL Johnie Flaming A, NP      PDMP not reviewed this encounter.   Johnie Flaming A, NP 11/24/23 1304
